# Patient Record
Sex: Male | Born: 1965 | Race: Black or African American | Hispanic: No | Marital: Married
Health system: Midwestern US, Community
[De-identification: ages and names within clinical notes are randomized; demographics above are authoritative.]

## PROBLEM LIST (undated history)

## (undated) HISTORY — PX: SPINE SURGERY: SHX786

---

## 2013-08-12 ENCOUNTER — Emergency Department (HOSPITAL_COMMUNITY)
Admission: EM | Admit: 2013-08-12 | Discharge: 2013-08-12 | Disposition: A | Discharge status: Home / Self Care | Payer: Medicaid - Out of State | Attending: Emergency Medicine | Admitting: Emergency Medicine

## 2013-08-12 ENCOUNTER — Encounter (HOSPITAL_COMMUNITY): Payer: Self-pay | Admitting: Emergency Medicine

## 2013-08-12 DIAGNOSIS — F17200 Nicotine dependence, unspecified, uncomplicated: Secondary | ICD-10-CM | POA: Insufficient documentation

## 2013-08-12 DIAGNOSIS — H5789 Other specified disorders of eye and adnexa: Secondary | ICD-10-CM | POA: Insufficient documentation

## 2013-08-12 DIAGNOSIS — H5712 Ocular pain, left eye: Secondary | ICD-10-CM

## 2013-08-12 DIAGNOSIS — L02419 Cutaneous abscess of limb, unspecified: Secondary | ICD-10-CM | POA: Insufficient documentation

## 2013-08-12 DIAGNOSIS — L02415 Cutaneous abscess of right lower limb: Secondary | ICD-10-CM

## 2013-08-12 DIAGNOSIS — L03115 Cellulitis of right lower limb: Secondary | ICD-10-CM

## 2013-08-12 DIAGNOSIS — L03119 Cellulitis of unspecified part of limb: Principal | ICD-10-CM

## 2013-08-12 MED ORDER — HYDROCODONE-ACETAMINOPHEN 5-325 MG PO TABS: 1.0000 | ORAL_TABLET | ORAL | Status: DC | PRN

## 2013-08-12 MED ORDER — IBUPROFEN 800 MG PO TABS: 800.0000 mg | ORAL_TABLET | Freq: Three times a day (TID) | ORAL | Status: DC | PRN

## 2013-08-12 MED ORDER — POLYMYXIN B-TRIMETHOPRIM 10000-0.1 UNIT/ML-% OP SOLN: 1.0000 [drp] | OPHTHALMIC | Status: DC

## 2013-08-12 MED ORDER — SULFAMETHOXAZOLE-TRIMETHOPRIM 800-160 MG PO TABS: 2.0000 | ORAL_TABLET | Freq: Two times a day (BID) | ORAL | Status: DC

## 2013-08-12 NOTE — ED Provider Notes (Signed)
CSN: [id number]     Arrival date & time 08/12/13  [id number] History   First MD Initiated Contact with Patient 08/12/13 [phone]     Chief Complaint  Patient presents with  . Eye Problem  . Abscess     (Consider location/radiation/quality/duration/timing/severity/associated sxs/prior Treatment) HPI  Pt presents with two problems.   States he has had a cyst on his right hip for the past year, states over the past week it has gotten big and red and painful.  No drainage.  No fevers.  No prior treatment.  Has had similar lesion on his right face that needed I&D, requests the same for this.    Also reports waking up this morning with "white pus" at the corner of his left eye.  Has an "indescribable" feeling in the left eye.  Denies itching, denies pain.  Denies fever or URI symptoms.  Denies trauma or FB in the eye.  No contact lenses.    History reviewed. No pertinent past medical history. Past Surgical History  Procedure Laterality Date  . Spine surgery     No family history on file. History  Substance Use Topics  . Smoking status: Current Every Day Smoker  . Smokeless tobacco: Not on file  . Alcohol Use: Yes     Comment: social    Review of Systems  All other systems reviewed and are negative.     Allergies  Review of patient's allergies indicates no known allergies.  Home Medications  No current outpatient prescriptions on file. BP 127/70  Pulse 88  Temp(Src) 98.2 F (36.8 C) (Oral)  Resp 18  Ht 5\' 11"  (1.803 m)  Wt 214 lb 1 oz (97.098 kg)  BMI 29.87 kg/m2  SpO2 98% Physical Exam  Nursing note and vitals reviewed. Constitutional: He appears well-developed and well-nourished. No distress.  HENT:  Head: Normocephalic and atraumatic.  Eyes: Conjunctivae, EOM and lids are normal. Pupils are equal, round, and reactive to light. Right eye exhibits no discharge. Left eye exhibits no discharge. Left conjunctiva is not injected. Left conjunctiva has no hemorrhage. No scleral  icterus.  Neck: Neck supple.  Pulmonary/Chest: Effort normal.  Neurological: He is alert.  Skin: He is not diaphoretic.       ED Course  Procedures (including critical care time) Labs Review Labs Reviewed - No data to display Imaging Review No results found.   EKG Interpretation None      INCISION AND DRAINAGE Performed by: Trixie DredgeEmily Zaylen Susman Consent: Verbal consent obtained. Risks and benefits: risks, benefits and alternatives were discussed Type: abscess  Body area: right hip  Anesthesia: local infiltration  Incision was made with a scalpel.  Local anesthetic: lidocaine 2% with epinephrine  Anesthetic total: 3.5 ml  Complexity: complex Blunt dissection to break up loculations  Drainage: purulent, cheesy and thick yellow   Drainage amount: moderate  Packing material: none  Patient tolerance: Patient tolerated the procedure well with no immediate complications.   Filed Vitals:   08/12/13 0835  BP: 127/70  Pulse: 88  Temp: 98.2 F (36.8 C)  Resp: 18      MDM   Final diagnoses:  Abscess of right hip  Discomfort of left eye  Cellulitis of right hip   Afebrile nontoxic patient with right hip infected cyst.  I&D with great relief in ED.  Overlying cellulitis, approx 14cm diameter.  D/C home with norco, bactrim, ibuprofen.  Also has mild symptoms suggestive of early conjunctivitis of left eye- d/c with polytrim soln.  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  Discussed strict return precautions. Discussed result, findings, treatment, and follow up  with patient.  Pt given return precautions.  Pt verbalizes understanding and agrees with plan.        [place], PA-C 08/12/13 [phone]

## 2013-08-12 NOTE — ED Provider Notes (Signed)
Medical screening examination/treatment/procedure(s) were performed by non-physician practitioner and as supervising physician I was immediately available for consultation/collaboration.   EKG Interpretation None      Shynia Daleo, MD, FACEP   Briceida Rasberry L Esias Mory, MD 08/12/13 1554 

## 2013-08-12 NOTE — ED Notes (Signed)
Pt c/o abscess to right hip ongoing for years. Pt also request his left eye be checked to be sure he doesn't have any infection. Pt has not tried any over the counter medications for symptoms at present.

## 2013-08-12 NOTE — Discharge Instructions (Signed)
Read the information below.  Use the prescribed medication as directed.  Please discuss all new medications with your pharmacist.  Do not take additional tylenol while taking the prescribed pain medication to avoid overdose.  Do not drive or make legal decisions while taking the narcotic pain medication (norco). You may return to the Emergency Department at any time for worsening condition or any new symptoms that concern you.  If you develop increased redness, swelling, pus draining from the wound, or fevers greater than 100.4, return to the ER immediately for a recheck.      Abscess An abscess is an infected area that contains a collection of pus and debris.It can occur in almost any part of the body. An abscess is also known as a furuncle or boil. CAUSES  An abscess occurs when tissue gets infected. This can occur from blockage of oil or sweat glands, infection of hair follicles, or a minor injury to the skin. As the body tries to fight the infection, pus collects in the area and creates pressure under the skin. This pressure causes pain. People with weakened immune systems have difficulty fighting infections and get certain abscesses more often.  SYMPTOMS Usually an abscess develops on the skin and becomes a painful mass that is red, warm, and tender. If the abscess forms under the skin, you may feel a moveable soft area under the skin. Some abscesses break open (rupture) on their own, but most will continue to get worse without care. The infection can spread deeper into the body and eventually into the bloodstream, causing you to feel ill.  DIAGNOSIS  Your caregiver will take your medical history and perform a physical exam. A sample of fluid may also be taken from the abscess to determine what is causing your infection. TREATMENT  Your caregiver may prescribe antibiotic medicines to fight the infection. However, taking antibiotics alone usually does not cure an abscess. Your caregiver may need to  make a small cut (incision) in the abscess to drain the pus. In some cases, gauze is packed into the abscess to reduce pain and to continue draining the area. HOME CARE INSTRUCTIONS   Only take over-the-counter or prescription medicines for pain, discomfort, or fever as directed by your caregiver.  If you were prescribed antibiotics, take them as directed. Finish them even if you start to feel better.  If gauze is used, follow your caregiver's directions for changing the gauze.  To avoid spreading the infection:  Keep your draining abscess covered with a bandage.  Wash your hands well.  Do not share personal care items, towels, or whirlpools with others.  Avoid skin contact with others.  Keep your skin and clothes clean around the abscess.  Keep all follow-up appointments as directed by your caregiver. SEEK MEDICAL CARE IF:   You have increased pain, swelling, redness, fluid drainage, or bleeding.  You have muscle aches, chills, or a general ill feeling.  You have a fever. MAKE SURE YOU:   Understand these instructions.  Will watch your condition.  Will get help right away if you are not doing well or get worse. Document Released: 01/28/2005 Document Revised: 10/20/2011 Document Reviewed: 07/03/2011 [facility] Patient Information 2014 [place], [place].  Abscess Care After An abscess (also called a boil or furuncle) is an infected area that contains a collection of pus. Signs and symptoms of an abscess include pain, tenderness, redness, or hardness, or you may feel a moveable soft area under your skin. An abscess can occur  anywhere in the body. The infection may spread to surrounding tissues causing cellulitis. A cut (incision) by the surgeon was made over your abscess and the pus was drained out. Gauze may have been packed into the space to provide a drain that will allow the cavity to heal from the inside outwards. The boil may be painful for 5 to 7 days. Most people with a  boil do not have high fevers. Your abscess, if seen early, may not have localized, and may not have been lanced. If not, another appointment may be required for this if it does not get better on its own or with medications. HOME CARE INSTRUCTIONS   Only take over-the-counter or prescription medicines for pain, discomfort, or fever as directed by your caregiver.  When you bathe, soak and then remove gauze or iodoform packs at least daily or as directed by your caregiver. You may then wash the wound gently with mild soapy water. Repack with gauze or do as your caregiver directs. SEEK IMMEDIATE MEDICAL CARE IF:   You develop increased pain, swelling, redness, drainage, or bleeding in the wound site.  You develop signs of generalized infection including muscle aches, chills, fever, or a general ill feeling.  An oral temperature above 102 F (38.9 C) develops, not controlled by medication. See your caregiver for a recheck if you develop any of the symptoms described above. If medications (antibiotics) were prescribed, take them as directed. Document Released: 11/06/2004 Document Revised: 07/13/2011 Document Reviewed: 07/04/2007 [facility] Patient Information 2014 [place], [place].   Cellulitis Cellulitis is an infection of the skin and the tissue beneath it. The infected area is usually red and tender. Cellulitis occurs most often in the arms and lower legs.  CAUSES  Cellulitis is caused by bacteria that enter the skin through cracks or cuts in the skin. The most common types of bacteria that cause cellulitis are Staphylococcus and Streptococcus. SYMPTOMS   Redness and warmth.  Swelling.  Tenderness or pain.  Fever. DIAGNOSIS  Your caregiver can usually determine what is wrong based on a physical exam. Blood tests may also be done. TREATMENT  Treatment usually involves taking an antibiotic medicine. HOME CARE INSTRUCTIONS   Take your antibiotics as directed. Finish them even if you  start to feel better.  Keep the infected arm or leg elevated to reduce swelling.  Apply a warm cloth to the affected area up to 4 times per day to relieve pain.  Only take over-the-counter or prescription medicines for pain, discomfort, or fever as directed by your caregiver.  Keep all follow-up appointments as directed by your caregiver. SEEK MEDICAL CARE IF:   You notice red streaks coming from the infected area.  Your red area gets larger or turns dark in color.  Your bone or joint underneath the infected area becomes painful after the skin has healed.  Your infection returns in the same area or another area.  You notice a swollen bump in the infected area.  You develop new symptoms. SEEK IMMEDIATE MEDICAL CARE IF:   You have a fever.  You feel very sleepy.  You develop vomiting or diarrhea.  You have a general ill feeling (malaise) with muscle aches and pains. MAKE SURE YOU:   Understand these instructions.  Will watch your condition.  Will get help right away if you are not doing well or get worse. Document Released: 01/28/2005 Document Revised: 10/20/2011 Document Reviewed: 07/06/2011 [facility] Patient Information 2014 [place], [place].

## 2013-08-12 NOTE — ED Notes (Signed)
Room prepared for PA request for Abscess Care.

## 2013-08-12 NOTE — ED Notes (Signed)
Bump noted on the right hip that the patient complains is "bothering me when I sit," Patient reports it has been there "for years, but it wasn't bothering me till now."

## 2013-08-12 NOTE — ED Notes (Signed)
PA at the bedside with Scribe completing Abscess care.

## 2013-09-11 ENCOUNTER — Emergency Department (HOSPITAL_COMMUNITY): Payer: 59

## 2013-09-11 ENCOUNTER — Emergency Department (HOSPITAL_COMMUNITY)
Admission: EM | Admit: 2013-09-11 | Discharge: 2013-09-11 | Disposition: A | Discharge status: Home / Self Care | Payer: 59 | Attending: Emergency Medicine | Admitting: Emergency Medicine

## 2013-09-11 ENCOUNTER — Encounter (HOSPITAL_COMMUNITY): Payer: Self-pay | Admitting: Emergency Medicine

## 2013-09-11 DIAGNOSIS — F17200 Nicotine dependence, unspecified, uncomplicated: Secondary | ICD-10-CM | POA: Insufficient documentation

## 2013-09-11 DIAGNOSIS — Y929 Unspecified place or not applicable: Secondary | ICD-10-CM | POA: Insufficient documentation

## 2013-09-11 DIAGNOSIS — X58XXXA Exposure to other specified factors, initial encounter: Secondary | ICD-10-CM | POA: Insufficient documentation

## 2013-09-11 DIAGNOSIS — Y9389 Activity, other specified: Secondary | ICD-10-CM | POA: Insufficient documentation

## 2013-09-11 DIAGNOSIS — S6980XA Other specified injuries of unspecified wrist, hand and finger(s), initial encounter: Secondary | ICD-10-CM | POA: Insufficient documentation

## 2013-09-11 DIAGNOSIS — S6990XA Unspecified injury of unspecified wrist, hand and finger(s), initial encounter: Principal | ICD-10-CM | POA: Insufficient documentation

## 2013-09-11 DIAGNOSIS — M79645 Pain in left finger(s): Secondary | ICD-10-CM

## 2013-09-11 NOTE — ED Provider Notes (Signed)
CSN: [id number]     Arrival date & time 09/11/13  2030 History   First MD Initiated Contact with Patient 09/11/13 2145     Chief Complaint  Patient presents with  . Finger Injury     (Consider location/radiation/quality/duration/timing/severity/associated sxs/prior Treatment) HPI 48 year old male presents with left middle finger pain over last couple weeks. He states 2 weeks ago he put a finger up a woman's anus while having sex. He states he had a stool on his finger is worried because he had a break in the skin. 3 days later he started having pain. 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 Patient states that his finger was previously swollen but has progressively become less swollen over the last week or so. He now has pain at the proximal aspect of his finger. He states it hurts to move his finger but is able to fully make a fist, which is unable to do earlier. He rates the pain as about a 7/10 and sharp. He states ibuprofen and Tylenol helps the pain. Declines wanting pain medicine at this time. Denies any redness or had any fevers.  History reviewed. No pertinent past medical history. Past Surgical History  Procedure Laterality Date  . Spine surgery     History reviewed. No pertinent family history. History  Substance Use Topics  . Smoking status: Current Every Day Smoker  . Smokeless tobacco: Not on file  . Alcohol Use: Yes     Comment: social    Review of Systems  Constitutional: Negative for fever.  Gastrointestinal: Negative for vomiting.  Musculoskeletal: Positive for joint swelling.  Skin: Negative for wound.  Neurological: Negative for weakness and numbness.  All other systems reviewed and are negative.     Allergies  Review of patient's allergies indicates no known allergies.  Home Medications   Prior to Admission medications   Not on File   BP 120/81  Pulse 76  Temp(Src) 97.5 F (36.4 C) (Oral)   Resp 16  SpO2 97% Physical Exam  Nursing note and vitals reviewed. Constitutional: He is oriented to person, place, and time. He appears well-developed and well-nourished. No distress.  HENT:  Head: Normocephalic and atraumatic.  Right Ear: External ear normal.  Left Ear: External ear normal.  Nose: Nose normal.  Eyes: Right eye exhibits no discharge. Left eye exhibits no discharge.  Neck: Neck supple.  Cardiovascular: Normal rate.   Pulmonary/Chest: Effort normal.  Abdominal: He exhibits no distension.  Musculoskeletal: He exhibits no edema.       Hands: Neurological: He is alert and oriented to person, place, and time.  Skin: Skin is warm and dry.    ED Course  Procedures (including critical care time) Labs Review Labs Reviewed - No data to display  Imaging Review Dg Finger Middle Left  09/11/2013   CLINICAL DATA:  Traumatic injury and pain  EXAM: LEFT MIDDLE FINGER 2+V  COMPARISON:  None.  FINDINGS: There is no evidence of fracture or dislocation. There is no evidence of arthropathy or other focal bone abnormality. Soft tissues are unremarkable.  IMPRESSION: No acute abnormality is noted.   Electronically Signed   By: Alcide CleverMark  Lukens M.D.   On: 09/11/2013 22:46     EKG Interpretation None      MDM   Final diagnoses:  Pain in finger of left hand    Patient's exam is benign with mild tenderness with range of  motion and no swelling noted. There is no sign of infection. He states he was only mildly swollen prior to improving the last few days. I have low suspicion that this is an infectious tenosynovitis. More likely he has a strain or sprain from his work. As he has no neurovascular deficits and no concern for infection, I feel he can be discharge with RICE. Advise a splint as needed for comfort. At this point will refer to a hand specialist.    Audree CamelScott T Nnaemeka Samson, MD 09/11/13 2340

## 2013-09-11 NOTE — ED Notes (Addendum)
Presents with left middle finger pain began 2 weeks ago after putting finger in a woman's anus during sex. Reports the pain is from the top knuckle down and at times he is unable to bend finger and making a fist is painful. Worried about infection or injury to finger. Denies fevers.

## 2015-04-09 IMAGING — CR DG FINGER MIDDLE 2+V*L*
3 series · 3 of 3 positions shown · non-contrast
Comparison: None.

CLINICAL DATA: Traumatic injury and pain

EXAM:
LEFT MIDDLE FINGER 2+V

[x finger pa left]
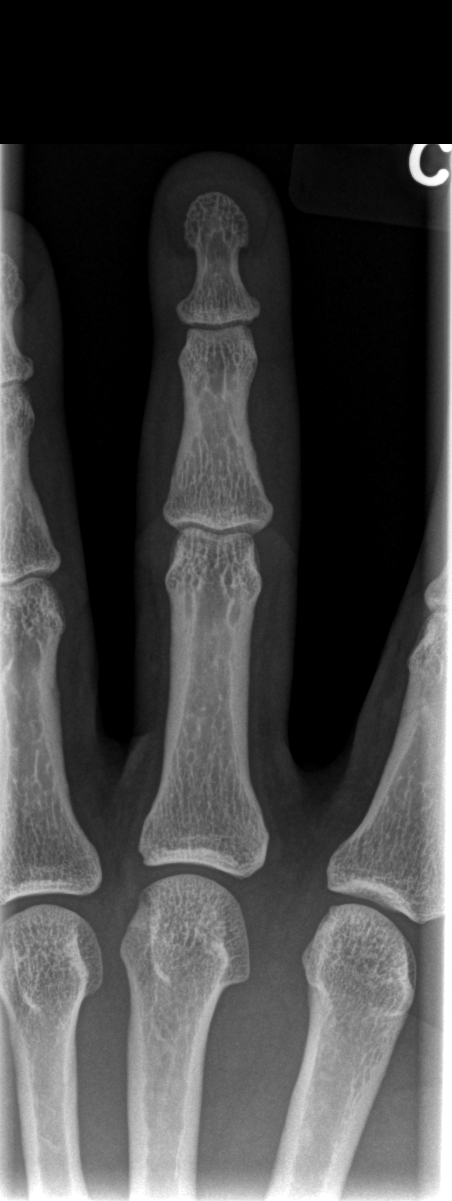

[x finger obl. left]
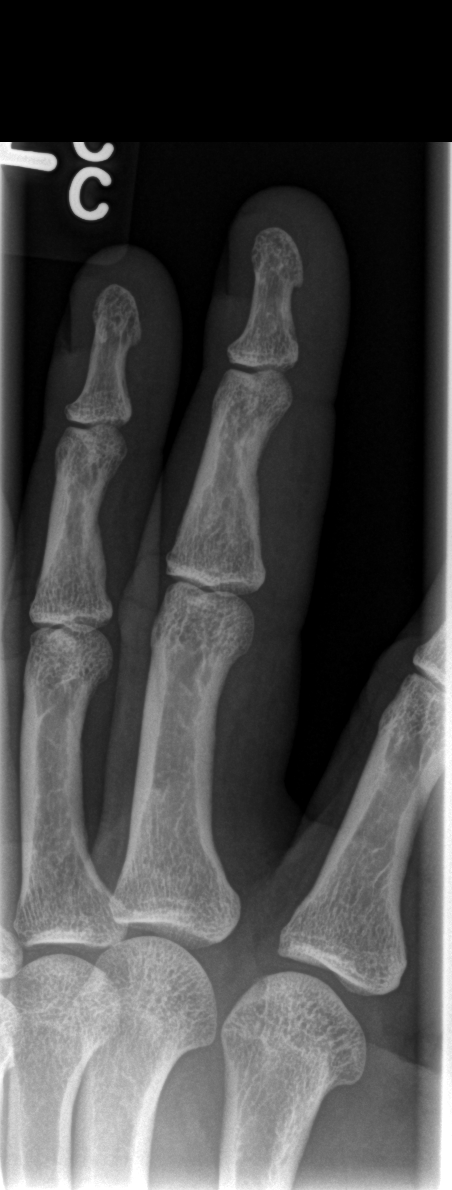

[x finger lateral left]
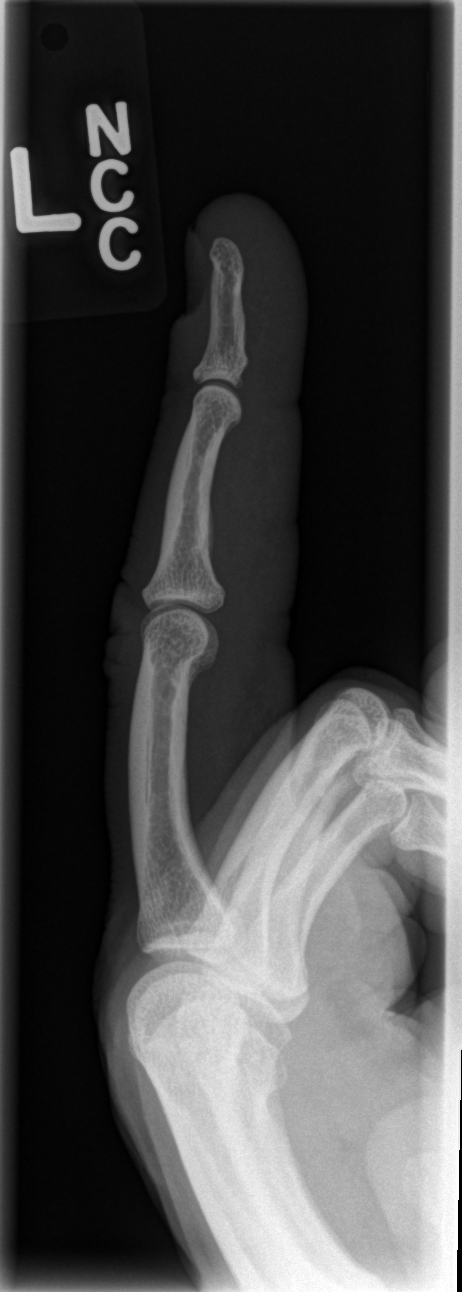

[3 of 3 positions shown; findings below may reference images not displayed]

FINDINGS: There is no evidence of fracture or dislocation. There is no
evidence of arthropathy or other focal bone abnormality. Soft
tissues are unremarkable.
IMPRESSION: No acute abnormality is noted.

## 2016-09-29 ENCOUNTER — Inpatient Hospital Stay
Admit: 2016-09-29 | Discharge: 2016-09-30 | Disposition: A | Discharge status: Home / Self Care | Payer: MEDICAID | Attending: Emergency Medicine

## 2016-09-29 DIAGNOSIS — F1410 Cocaine abuse, uncomplicated: Secondary | ICD-10-CM

## 2016-09-29 NOTE — ED Notes (Signed)
I have reviewed discharge instructions with the patient.  The patient verbalized understanding. Pt provided with a list of out patient resources on DC Advised to return to ED when symptoms increase Stable at DC

## 2016-09-29 NOTE — Progress Notes (Signed)
Comprehensive Assessment Form Part 1      Section I - Disposition    The on-call Psychiatrist consulted was Dr. Cheri RousGulati   The admitting Diagnosis is none    Payor/Plan Subscr DOB Sex Relation Sub. Ins. ID Effective Group Num   1. [employer] COMMUNITY[employer]ella Clark* Clark,[name] 01/11/1966 Male Self [id number] 05/04/16 [id number]                                   [address]       Section II - Integrated Summary  SUMMARY:  Summary:  The patient is a 51 y.o. year old BL[place] OR [place] male brought to the Emergency Department via cab and referred by self. Pt presented to ed requesting detox. 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 Pt reports since he relapsed he has been using alcohol, cocaine/crack and heroin. Pt reports he has been drinking 4- 40 oz beers and last drank this morning. Pt reports he has been spending $900 a day on cocaine/crack and last used $600 worth this afternoon. Pt reports he last used 2 or 3 bags of heroin yesterday. Pt tox pos for cocaine and bal <5. Pt reports he has been to detox and rehab 16 years ago and has been going to [employer] meetings. Pt denies hx of seizures, blackouts and dts. Pt reports hx of accidental od. Pt reports his withdrawal symptoms include anxiety and a headache. Pt ciwa/etoh score 3 and cow score 2. Pt pulse 89 and bp 121/83. Pt denies si, hi and a/v hallucinations. Pt denies hx of self harm. Pt reports hx of inpt/outpt mh tx. Pt reports he was hospitalized for mh a couple years ago. Pt did not recall where or why. Pt reports he has been dx with bipolar and depression. Pt reports the last time he saw a psychiatrist was 3 years ago. Pt denies being prescribed any psych meds now. Pt [place]. When this writer asked pt where he lives now, pt was unable to provide a clear answer. Pt reports he is married and that he has not saw her in 14 years.  Pt reports family hx of sa. 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  The Chief Complaint is substance abuse.  The Precipitant Factors are detox.  Previous Hospitalizations: yes   The patient has not previously been in restraints.  Current Psychiatrist and/or Case Manager is none.    Lethality Assessment:    The potential for suicide noted by the following:not noted.  The potential for homicide is not noted.  The patient  has reported access to weapons.   The patient has not been a perpetrator of sexual or physical abuse.    The patient is not felt to be at risk for self harm or harm to others.      Section III - Psychosocial  Patient presents with depression and anxiety. Onset of symptoms was gradual.  Patient states symptoms have been exacerbated by financial burdens and chemical dependency.  The patient's appearance shows no evidence of impairment.  The patient's behavior shows poor eye contact. The patient is oriented to time, place, person and situation.  Feelings of helplessness and hopelessness are not observed.   The patient's appetite shows  no evidence of impairment.    Sleep Pattern  Sleep Pattern: Broken, Difficulty falling asleep  Usual # of Hours of Sleep/Night: 8  Current # of Hours of Sleep/Night: 0     The patient speaks [place] as a primary language.  The patient has no communication impairments affecting communication. The highest grade achieved is GED with the overall quality of school experience being described as fair.     The patient is married.  The patient lives homeless. The patient n/a  plan to return home upon discharge.  The patient's source of income comes from social security.  The patient is currently disabled.      The patient's greatest support comes from none and this person n/a be involved with the treatment.      The patient has not been in an event described as horrible or outside the  realm of ordinary life experience either currently or in the past.  The patient has not been a victim of sexual/physical abuse.    Legal status is none.  The patient does not have legal issues pending.     Section IV - Substance Abuse  The patient does has a substance abuse problem. The patient does not have current substance abuse treatment providers.    Alcohol  Age First Got High:  (teenager) (09/29/16 2114)  Frequency of Use:  (last 4 days) (09/29/16 2114)  Average Amount Used: 4- 40 oz beers (09/29/16 2114)  How Long Using at Current Pattern: 4 days (09/29/16 2114)  Date of Last Use: 09/29/16 (09/29/16 2114)  Time of Last Use:  (morning) (09/29/16 2114)  Amount of Last Use: unknown (09/29/16 2114)  Are You Able to Stop on Your Own: No (09/29/16 2114)  Symptoms: Depressed mood;Morning use;Anxiety (09/29/16 2114)           Tobacco  Age First Used: 6 (09/29/16 2114)  Current Amount Used (# of packs/day): 1 (09/29/16 2114)  Date of Last Use: 09/28/16 (09/29/16 2114)  Time of Last Use:  (unknown) (09/29/16 2114)  Are You Able to Stop on Your Own: No (09/29/16 2114)              Cocaine/Crack  Age First Got High: 16 (09/29/16 2114)  Pattern of Use in Past Year: $900 a day (09/29/16 2114)  Frequency of Use:  (last 4 days) (09/29/16 2114)  Average Amount Used: $900  (09/29/16 2114)  How Long Using at Current Pattern: 4 days (09/29/16 2114)  Date of Last Use: 09/29/16 (09/29/16 2114)  Time of Last Use:  (afternoon) (09/29/16 2114)  Amount of Last Use: $500 (09/29/16 2114)  Are you able to stop on your own: No (09/29/16 2114)  Symptoms: Anxiety;Depressed mood (09/29/16 2114)         Heroin  Age First Got High: 12 (09/29/16 2114)  Pattern of Use in Past Year: unknown (09/29/16 2114)  Frequency of Use: Unknown (09/29/16 2114)  Average Amount Used: 2 or 3 bags (09/29/16 2114)  How Long Using at Current Pattern: 4 days (09/29/16 2114)  Date of Last Use: 09/28/16 (09/29/16 2114)  Time of Last Use:  (unknown) (09/29/16 2114)   Amount of Last Use: 2 or 3 bags (09/29/16 2114)  Are you able to stop on your own: No (09/29/16 2114)  Symptoms: Depressed mood;Anxiety (09/29/16 2114)                    Section V - Mental Status Exam    Attitude and Behavior  General Attitude: Cooperative  Affect: Dull  Mood: Depressed, Irritable  Insight: Fair  Judgement: Intact  Memory : Intact  Thought Content: Unremarkable  Hallucinations: None  Delusions: None  Concentration: Poor  Speech Pattern: Unremarkable  Thought Process: Unremarkable  Motor Activity: Unremarkable          Mason Jim

## 2016-09-29 NOTE — Progress Notes (Signed)
When discharging pt, pt reports he is homeless and needs emergency housing. This Clinical research associatewriter offered to call [facility]. [facility], [facility] then [facility] him [facility]. Pt told this Clinical research associatewriter to call [facility]. [facility] and spoke with Schering-PloughCrystal, who said pt did go to their facility for detox but there was no available beds. Crystal reports pt can come to shelter tonight, then try again to get into their detox tomorrow. [facility] provided [facility] address, [address] [place] [phone].

## 2016-09-29 NOTE — ED Triage Notes (Signed)
Patient would like help to detox from drugs and alcohol ;  Patient had 16 years of sobriety up until 4 days ago.

## 2016-09-29 NOTE — ED Notes (Signed)
UA specimen collected and sent to lab, awaiting results

## 2016-09-29 NOTE — ED Provider Notes (Signed)
Patient is a 51 y.o. male presenting with addiction problem. The history is provided by the patient.   Addiction problem   There areno confusion and no seizures present at this time.  This is a recurrent problem. Pertinent negatives include no fever, no nausea and no vomiting.        Pt reports relapse of substance abuse 4 days ago.  Has been using crack cocaine, heroin, and EtOH constantly for 4 days.  Last use of all was around noon today.  Pt denies any symptoms at this time.          History reviewed. No pertinent past medical history.    History reviewed. No pertinent surgical history.      History reviewed. No pertinent family history.    Social History     Social History   ??? Marital status: MARRIED     Spouse name: N/A   ??? Number of children: N/A   ??? Years of education: N/A     Occupational History   ??? Not on file.     Social History Main Topics   ??? Smoking status: Current Every Day Smoker   ??? Smokeless tobacco: Never Used   ??? Alcohol use 12.0 oz/week     20 Cans of beer per week   ??? Drug use: Yes     Special: Cocaine, Heroin   ??? Sexual activity: Not on file     Other Topics Concern   ??? Not on file     Social History Narrative   ??? No narrative on file         ALLERGIES: Review of patient's allergies indicates no known allergies.    Review of Systems   Constitutional: Negative for chills and fever.   Respiratory: Negative.    Cardiovascular: Negative.    Gastrointestinal: Negative for abdominal pain, diarrhea, nausea and vomiting.   Musculoskeletal: Negative for myalgias.   Neurological: Negative for dizziness, tremors, seizures, syncope and light-headedness.   Psychiatric/Behavioral: Negative for confusion.   All other systems reviewed and are negative.      Vitals:    09/29/16 1951   BP: 121/83   Pulse: 89   Resp: 15   Temp: 98.3 ??F (36.8 ??C)   SpO2: 96%   Weight: 95.7 kg (211 lb)   Height: 6' (1.829 m)            Physical Exam   Constitutional: He is oriented to person, place, and time. He appears  well-developed and well-nourished.   HENT:   Head: Normocephalic and atraumatic.   Mouth/Throat: Oropharynx is clear and moist and mucous membranes are normal.   Eyes: Conjunctivae and EOM are normal. Pupils are equal, round, and reactive to light.   Neck: Normal range of motion. Neck supple.   Cardiovascular: Normal rate, regular rhythm, normal heart sounds and intact distal pulses.  Exam reveals no gallop and no friction rub.    No murmur heard.  Pulmonary/Chest: Effort normal and breath sounds normal. No respiratory distress. He has no decreased breath sounds. He has no wheezes. He has no rhonchi. He has no rales.   Abdominal: Soft. He exhibits no distension and no mass. There is no tenderness. There is no rebound and no guarding.   Musculoskeletal: Normal range of motion. He exhibits no edema or tenderness.   Neurological: He is alert and oriented to person, place, and time. He has normal strength. He displays no tremor. No cranial nerve deficit. Coordination and gait normal.  Skin: Skin is warm and dry. No rash noted.   Psychiatric: He has a normal mood and affect. His speech is normal and behavior is normal.   Nursing note and vitals reviewed.       MDM      ED Course       Procedures      ED EKG interpretation:  Rhythm: normal sinus rhythm; and regular . Rate (approx.): 73; Axis: normal; P wave: normal; QRS interval: normal ; ST/T wave: normal; ; Other findings: normal. This EKG was interpreted by Edwena Blow, MD,ED Provider.      Labs reviewed - results are all unremarkable.  The patient's medical screening exam is complete and the patient is medically clear for evaluation by mental health/detox screener.      Pt seen by psych SW who d/w Dr. Sebastian Ache.  Pt does not meet criteria for inpatient detox.        <EMERGENCY DEPARTMENT CASE SUMMARY>    Plan: Discharge home with referral to outpatient substance abuse treatment.      ED Course: as noted above    Final Impression/Diagnosis: multiple substance abuse       Patient condition at time of disposition: stable      I have reviewed the following home medications:    Prior to Admission medications    Not on File         Edwena Blow, MD        Recent Results (from the past 12 hour(s))   EKG, 12 LEAD, INITIAL    Collection Time: 09/29/16  8:08 PM   Result Value Ref Range    Ventricular Rate 71 BPM    Atrial Rate 71 BPM    P-R Interval 112 ms    QRS Duration 82 ms    Q-T Interval 380 ms    QTC Calculation (Bezet) 412 ms    Calculated P Axis 60 degrees    Calculated R Axis 43 degrees    Calculated T Axis 5 degrees    Diagnosis       Normal sinus rhythm  Normal ECG  No previous ECGs available     CBC WITH AUTOMATED DIFF    Collection Time: 09/29/16  8:10 PM   Result Value Ref Range    WBC 6.9 4.8 - 11.0 K/uL    RBC 4.66 (L) 4.70 - 6.10 M/uL    HGB 15.3 13.5 - 17.5 g/dL    HCT 43.9 41.0 - 53.0 %    MCV 94.2 80.0 - 100.0 FL    MCH 32.8 (H) 27.0 - 31.0 PG    MCHC 34.9 31.0 - 37.0 g/dL    RDW 13.4 11.5 - 14.0 %    PLATELET 205 130 - 400 K/uL    MPV 9.8 (H) 6.5 - 9.5 FL    NEUTROPHILS 40 (L) 42.2 - 75.2 %    LYMPHOCYTES 48 20.5 - 51.1 %    MONOCYTES 11 0 - 12.0 %    EOSINOPHILS 1 0 - 7.0 %    BASOPHILS 0 0 - 2.5 %    ABS. NEUTROPHILS 2.8 2.0 - 8.1 K/UL    ABS. LYMPHOCYTES 3.3 1.3 - 3.4 K/UL    ABS. MONOCYTES 0.7 0.1 - 1.0 K/UL    ABS. EOSINOPHILS 0.1 0.0 - 0.2 K/UL    ABS. BASOPHILS 0.0 0.0 - 0.1 K/UL    DF AUTOMATED     METABOLIC PANEL, COMPREHENSIVE    Collection Time: 09/29/16  8:10  PM   Result Value Ref Range    Sodium 143 136 - 145 mmol/L    Potassium 3.5 3.5 - 5.1 mmol/L    Chloride 105 98 - 107 mmol/L    CO2 28 21 - 32 mmol/L    Anion gap 10 4 - 12 mmol/L    Glucose 106 74 - 106 mg/dL    BUN 19 (H) 7 - 18 mg/dL    Creatinine 1.77 (H) 0.70 - 1.30 mg/dL    GFR est AA 52 (L) >60 ml/min/1.62m    GFR est non-AA 43 (L) >60 ml/min/1.725m   Calcium 8.8 8.5 - 10.1 mg/dL    Bilirubin, total 0.7 0.2 - 1.0 mg/dL    ALT (SGPT) 29 12 - 78 U/L    AST (SGOT) 16 15 - 37 U/L     Alk. phosphatase 64 46 - 116 U/L    Protein, total 7.1 6.4 - 8.2 g/dL    Albumin 3.7 3.4 - 5.0 g/dL    Globulin 3.4 2.8 - 3.9 g/dL    A-G Ratio 1.1 1.0 - 1.5     PROTHROMBIN TIME + INR    Collection Time: 09/29/16  8:10 PM   Result Value Ref Range    Prothrombin time 10.4 9.5 - 11.5 sec    INR 1.0 0.8 - 1.2     PTT    Collection Time: 09/29/16  8:10 PM   Result Value Ref Range    aPTT 24.5 21.8 - 30.7 SEC   ETHYL ALCOHOL    Collection Time: 09/29/16  8:10 PM   Result Value Ref Range    ETHYL ALCOHOL, SERUM <5.0 <5 MG/DL   DRUG SCREEN, URINE    Collection Time: 09/29/16  9:20 PM   Result Value Ref Range    AMPHETAMINES NEGATIVE  NEG      Methamphetamines NEGATIVE  NEG      BARBITURATES NEGATIVE  NEG      BENZODIAZEPINES NEGATIVE  NEG      COCAINE Presumptive Positive (A) NEG      METHADONE NEGATIVE  NEG      OPIATES NEGATIVE  NEG      PCP(PHENCYCLIDINE) BACKUP METHOD USED. DRUG NOT INCLUDED ON PANELS. (A) NEG      THC (TH-CANNABINOL) NEGATIVE  NEG      TRICYCLICS NEGATIVE  NEG     URINALYSIS W/ RFLX MICROSCOPIC    Collection Time: 09/29/16  9:20 PM   Result Value Ref Range    Color AMBER      Appearance CLEAR      Specific gravity 1.025      pH (UA) 6.0      Protein NEGATIVE  mg/dL    Glucose NEGATIVE  mg/dL    Ketone 5 mg/dL    Bilirubin NEGATIVE       Blood NEGATIVE       Urobilinogen 1.0 EU/dL    Nitrites NEGATIVE       Leukocyte Esterase NEGATIVE

## 2016-09-29 NOTE — ED Notes (Signed)
Lunch bag given, eating [place] sandwich and having ginger ale at this time, patient aware that he needs to provide urine specimen, will continue to monitor

## 2016-09-29 NOTE — Progress Notes (Signed)
Pt does not have a means to get [facility]. Spoke with nursing supervisor kim about getting a voucher for [facility] to get to [facility]. Transportation is being set up with taxi company.

## 2016-09-29 NOTE — Progress Notes (Signed)
BEHAVIORAL HEALTH DISCHARGE INSTRUCTIONS/REFERRALS:      [name] Clark            [address]  [place] [address]           [phone] (home)   DOB: Jan 22, 1966               Payor/Plan Subscr DOB Sex Relation Sub. Ins. ID Effective Group Num   1. [employer], [name] [birth year] Male Self [id number] 05/04/16 [id number]                                   [address]       There is no problem list on file for this patient.      Patient will be discharged per Dr. Cheri Rous      Was the patient referred to Independent Living Peer Diversion?  No  Independent Living Peer Supports [phone]    Was the patient referred to crisis residential/respite?  No  [facility] PATH program [facility] residents only)   [phone]    Will [facility] contact patient for well check visit?  No    May we call you after discharge (if Yes, give the best number)?   No  May we leave a message on your phone?  No    Consents signed:      Discharge Instructions, Appointments, and Referrals:    Social worker consulted with Dr. Cheri Rous. Patient does not meet criteria for medical detox. If symptoms increase, patient can come back for further evaluation. Patient denies suicidal/homicidal ideations. Patient has been provided with a list of referrals to maintain sobriety in the community.       FOR RESOURCES NOT LISTED OR ADDITIONAL SUPPORTS PLEASE CALL [place] [facility] [phone].    PATIENT ACKNOWLEDGEMENT: I hereby acknowledge that I have read the above instructions (or have had them read to me).  The instructions were explained to me and I was given the opportunity to ask questions.  I acknowledge that I understand the instructions.  I have received a copy of these instructions.        ______________________________________   09/29/2016  Screener's Signature        ______________________________________   09/29/2016  Signature of Patient or Representative         * If you feel that you are at risk of harming yourself please call:  National Suicide Prevention Lifeline    800-273-TALK   or one of the following:    EMERGENCY NUMBERS  [facility]          [phone]  [place]. Crisis                                 [phone]  [facility]. Crisis Team                            [phone]  [facility]. Crisis Team                      [phone]   Children's Crisis Outreach   [phone]  [facility] Crisis Team   [phone]. [place] Response Team  [phone]   [employer]Teens (for teens offered Mon-Thurs 4pm-10pm  Friday 4pm-12am, Sat. 5pm -12am)  [phone]  [place]th [place] Hotline (peer to peer for youth) 877-YOUTHLINE [phone])    Alternative Resources:    [place] Drug Abuse [place]  Family support [employer] Program (for families in [place], [place], [place], & [place])  [phone]    [facility] [employer] Outreach Team  (for families with children to seek community based supports)  [phone]

## 2016-09-30 LAB — EKG 12-LEAD
Atrial Rate: 73 {beats}/min
P Axis: 63 degrees
P-R Interval: 110 ms
Q-T Interval: 376 ms
QRS Duration: 84 ms
QTc Calculation (Bazett): 414 ms
R Axis: 39 degrees
T Axis: 6 degrees
Ventricular Rate: 73 {beats}/min

## 2016-09-30 LAB — EKG, 12 LEAD, INITIAL
Atrial Rate: 73 {beats}/min
Calculated P Axis: 63 degrees
Calculated R Axis: 39 degrees
Calculated T Axis: 6 degrees
P-R Interval: 110 ms
Q-T Interval: 376 ms
QRS Duration: 84 ms
QTC Calculation (Bezet): 414 ms
Ventricular Rate: 73 {beats}/min

## 2016-09-30 LAB — URINALYSIS W/ RFLX MICROSCOPIC
Bilirubin: NEGATIVE
Blood: NEGATIVE
Glucose: NEGATIVE mg/dL
Ketone: 5 mg/dL
Leukocyte Esterase: NEGATIVE
Nitrites: NEGATIVE
Protein: NEGATIVE mg/dL
Specific gravity: 1.025
Urobilinogen: 1 EU/dL
pH (UA): 6

## 2016-09-30 LAB — DRUG SCREEN, URINE
AMPHETAMINES: NEGATIVE
BARBITURATES: NEGATIVE
BENZODIAZEPINES: NEGATIVE
COCAINE: POSITIVE — AB
METHADONE: NEGATIVE
Methamphetamines: NEGATIVE
OPIATES: NEGATIVE
THC (TH-CANNABINOL): NEGATIVE
TRICYCLICS: NEGATIVE

## 2016-09-30 LAB — PROTHROMBIN TIME + INR
INR: 1 (ref 0.8–1.2)
Prothrombin time: 10.4 s (ref 9.5–11.5)

## 2016-09-30 LAB — METABOLIC PANEL, COMPREHENSIVE
A-G Ratio: 1.1 (ref 1.0–1.5)
ALT (SGPT): 29 U/L (ref 12–78)
AST (SGOT): 16 U/L (ref 15–37)
Albumin: 3.7 g/dL (ref 3.4–5.0)
Alk. phosphatase: 64 U/L (ref 46–116)
Anion gap: 10 mmol/L (ref 4–12)
BUN: 19 mg/dL — ABNORMAL HIGH (ref 7–18)
Bilirubin, total: 0.7 mg/dL (ref 0.2–1.0)
CO2: 28 mmol/L (ref 21–32)
Calcium: 8.8 mg/dL (ref 8.5–10.1)
Chloride: 105 mmol/L (ref 98–107)
Creatinine: 1.77 mg/dL — ABNORMAL HIGH (ref 0.70–1.30)
GFR est AA: 52 mL/min/{1.73_m2} — ABNORMAL LOW (ref >60)
GFR est non-AA: 43 mL/min/{1.73_m2} — ABNORMAL LOW (ref >60)
Globulin: 3.4 g/dL (ref 2.8–3.9)
Glucose: 106 mg/dL (ref 74–106)
Potassium: 3.5 mmol/L (ref 3.5–5.1)
Protein, total: 7.1 g/dL (ref 6.4–8.2)
Sodium: 143 mmol/L (ref 136–145)

## 2016-09-30 LAB — CBC WITH AUTOMATED DIFF
ABS. BASOPHILS: 0 10*3/uL (ref 0.0–0.1)
ABS. EOSINOPHILS: 0.1 10*3/uL (ref 0.0–0.2)
ABS. LYMPHOCYTES: 3.3 10*3/uL (ref 1.3–3.4)
ABS. MONOCYTES: 0.7 10*3/uL (ref 0.1–1.0)
ABS. NEUTROPHILS: 2.8 10*3/uL (ref 2.0–8.1)
BASOPHILS: 0 % (ref 0–2.5)
EOSINOPHILS: 1 % (ref 0–7.0)
HCT: 43.9 % (ref 41.0–53.0)
HGB: 15.3 g/dL (ref 13.5–17.5)
LYMPHOCYTES: 48 % (ref 20.5–51.1)
MCH: 32.8 PG — ABNORMAL HIGH (ref 27.0–31.0)
MCHC: 34.9 g/dL (ref 31.0–37.0)
MCV: 94.2 FL (ref 80.0–100.0)
MONOCYTES: 11 % (ref 0–12.0)
MPV: 9.8 FL — ABNORMAL HIGH (ref 6.5–9.5)
NEUTROPHILS: 40 % — ABNORMAL LOW (ref 42.2–75.2)
PLATELET: 205 10*3/uL (ref 130–400)
RBC: 4.66 M/uL — ABNORMAL LOW (ref 4.70–6.10)
RDW: 13.4 % (ref 11.5–14.0)
WBC: 6.9 10*3/uL (ref 4.8–11.0)

## 2016-09-30 LAB — PTT: aPTT: 24.5 s (ref 21.8–30.7)

## 2016-09-30 LAB — ETHYL ALCOHOL: ETHYL ALCOHOL, SERUM: <5 MG/DL (ref <5)
